# Patient Record
Sex: Male | Born: 1985 | Race: White | Hispanic: No | Marital: Married | Smoking: Never smoker
Health system: Southern US, Community
[De-identification: ages and names within clinical notes are randomized; demographics above are authoritative.]

## PROBLEM LIST (undated history)

## (undated) DIAGNOSIS — Z789 Other specified health status: Secondary | ICD-10-CM

## (undated) HISTORY — DX: Other specified health status: Z78.9

## (undated) HISTORY — PX: FINGER SURGERY: SHX640

---

## 2015-05-28 ENCOUNTER — Emergency Department (HOSPITAL_BASED_OUTPATIENT_CLINIC_OR_DEPARTMENT_OTHER): Payer: No Typology Code available for payment source

## 2015-05-28 ENCOUNTER — Emergency Department (HOSPITAL_BASED_OUTPATIENT_CLINIC_OR_DEPARTMENT_OTHER)
Admission: EM | Admit: 2015-05-28 | Discharge: 2015-05-29 | Disposition: A | Discharge status: Home / Self Care | Payer: No Typology Code available for payment source | Attending: Emergency Medicine | Admitting: Emergency Medicine

## 2015-05-28 ENCOUNTER — Encounter (HOSPITAL_BASED_OUTPATIENT_CLINIC_OR_DEPARTMENT_OTHER): Payer: Self-pay

## 2015-05-28 DIAGNOSIS — S6992XA Unspecified injury of left wrist, hand and finger(s), initial encounter: Secondary | ICD-10-CM | POA: Diagnosis present

## 2015-05-28 DIAGNOSIS — Y9289 Other specified places as the place of occurrence of the external cause: Secondary | ICD-10-CM | POA: Insufficient documentation

## 2015-05-28 DIAGNOSIS — Y999 Unspecified external cause status: Secondary | ICD-10-CM | POA: Diagnosis not present

## 2015-05-28 DIAGNOSIS — W2105XA Struck by basketball, initial encounter: Secondary | ICD-10-CM | POA: Insufficient documentation

## 2015-05-28 DIAGNOSIS — S62615A Displaced fracture of proximal phalanx of left ring finger, initial encounter for closed fracture: Secondary | ICD-10-CM | POA: Diagnosis not present

## 2015-05-28 DIAGNOSIS — S63285A Dislocation of proximal interphalangeal joint of left ring finger, initial encounter: Secondary | ICD-10-CM | POA: Diagnosis not present

## 2015-05-28 DIAGNOSIS — Y9367 Activity, basketball: Secondary | ICD-10-CM | POA: Diagnosis not present

## 2015-05-28 DIAGNOSIS — S63259A Unspecified dislocation of unspecified finger, initial encounter: Secondary | ICD-10-CM

## 2015-05-28 MED ORDER — BUPIVACAINE HCL (PF) 0.5 % IJ SOLN
10.0000 mL | Freq: Once | INTRAMUSCULAR | Status: AC
  Administered 2015-05-28: 10 mL
  Filled 2015-05-28: qty 10

## 2015-05-28 MED ORDER — HYDROCODONE-ACETAMINOPHEN 5-325 MG PO TABS: ORAL_TABLET | ORAL | Status: DC

## 2015-05-28 MED ORDER — LIDOCAINE HCL (PF) 1 % IJ SOLN: 30.0000 mL | Freq: Once | INTRAMUSCULAR | Status: DC

## 2015-05-28 NOTE — ED Notes (Signed)
Pt reports injuring his left 4th digit while playing basketball PTA.

## 2015-05-28 NOTE — Discharge Instructions (Signed)
For pain control please take ibuprofen (also known as Motrin or Advil) 800mg  (this is normally 4 over the counter pills) 3 times a day  for 5 days. Take with food to minimize stomach irritation.  Take vicodin for breakthrough pain, do not drink alcohol, drive, care for children or do other critical tasks while taking vicodin.  Please follow with your primary care doctor in the next 2 days for a check-up. They must obtain records for further management.   Do not hesitate to return to the Emergency Department for any new, worsening or concerning symptoms.    Finger Dislocation Finger dislocation is the displacement of bones in your finger at the joints. Most commonly, finger dislocation occurs at the proximal interphalangeal joint (the joint closest to your knuckle). Very strong, fibrous tissues (ligaments) and joint capsules connect the three bones of your fingers.  CAUSES Dislocation is caused by a forceful impact. This impact moves these bones off the joint and often tears your ligaments.  SYMPTOMS Symptoms of finger dislocation include:  Deformity of your finger.  Pain, with loss of movement. DIAGNOSIS  Finger dislocation is diagnosed with a physical exam. Often, X-ray exams are done to see if you have associated injuries, such as bone fractures. TREATMENT  Finger dislocations are treated by putting your bones back into position (reduction) either by manually moving the bones back into place or through surgery. Your finger is then kept in a fixed position (immobilized) with the use of a dressing or splint for a brief period. When your ligament has to be surgically repaired, it needs to be kept in a fixed position with a dressing or splint for 1 to 2 weeks. Because joint stiffness is a long-term complication of finger dislocation, hand exercises or physical therapy to increase the range of motion and to regain strength is usually started as soon as the ligament is healed. Exercises and  therapy generally last no more than 3 months. HOME CARE INSTRUCTIONS The following measures can help to reduce pain and speed up the healing process:  Rest your injured joint. Do not move until instructed otherwise by your caregiver. Avoid activities similar to the one that caused your injury.  Apply ice to your injured joint for the first day or 2 after your reduction or as directed by your caregiver. Applying ice helps to reduce inflammation and pain.  Put ice in a plastic bag.  Place a towel between your skin and the bag.  Leave the ice on for 15-20 minutes at a time, every 2 hours while you are awake.  Elevate your hand above your heart as directed by your caregiver to reduce swelling.  Take over-the-counter or prescription medicine for pain as your caregiver instructs you. SEEK IMMEDIATE MEDICAL CARE IF:  Your dressing or splint becomes damaged.  Your pain becomes worse rather than better.  You lose feeling in your finger, or it becomes cold and white. MAKE SURE YOU:  Understand these instructions.  Will watch your condition.  Will get help right away if you are not doing well or get worse. Document Released: 11/30/2000 Document Revised: 02/25/2012 Document Reviewed: 09/23/2011 [facility] Patient Information 2015 [place], [place]. This information is not intended to replace advice given to you by your health care provider. Make sure you discuss any questions you have with your health care provider.

## 2015-05-28 NOTE — ED Provider Notes (Signed)
CSN: [id number]     Arrival date & time 05/28/15  2028 History   First MD Initiated Contact with Patient 05/28/15 2046     Chief Complaint  Patient presents with  . Finger Injury     (Consider location/radiation/quality/duration/timing/severity/associated sxs/prior Treatment) HPI   Steve Park is a 29 y.o. male complaining of severe pain and decreased range of motion to left ring finger after patient was playing basketball just prior to arrival and he jammed the finger into a basketball. Patient is right-hand-dominant. No pain medication taken prior to arrival.pain is exacerbated by palpation. Pt is getting married in 2 weeks.  History reviewed. No pertinent past medical history. History reviewed. No pertinent past surgical history. History reviewed. No pertinent family history. History  Substance Use Topics  . Smoking status: Never Smoker   . Smokeless tobacco: Not on file  . Alcohol Use: Yes     Comment: social    Review of Systems  10 systems reviewed and found to be negative, except as noted in the HPI.   Allergies  Review of patient's allergies indicates no known allergies.  Home Medications   Prior to Admission medications   Medication Sig Start Date End Date Taking? Authorizing Provider  HYDROcodone-acetaminophen (NORCO/VICODIN) 5-325 MG per tablet Take 1-2 tablets by mouth every 6 hours as needed for pain and/or cough. 05/28/15   Jonathin Heinicke, PA-C   BP 122/94 mmHg  Pulse 91  Temp(Src) 98.1 F (36.7 C) (Oral)  Resp 16  Ht 6' (1.829 m)  Wt 180 lb (81.647 kg)  BMI 24.41 kg/m2  SpO2 96% Physical Exam  Constitutional: He is oriented to person, place, and time. He appears well-developed and well-nourished. No distress.  HENT:  Head: Normocephalic.  Eyes: Conjunctivae and EOM are normal.  Cardiovascular: Normal rate.   Pulmonary/Chest: Effort normal. No stridor.  Musculoskeletal: Normal range of motion. He exhibits edema and tenderness.  Swelling and  deformity to left fourth digit PIP, no overlying skin changes, he is distally neurovascularly intact.  Neurological: He is alert and oriented to person, place, and time.  Psychiatric: He has a normal mood and affect.  Nursing note and vitals reviewed.   ED Course  Reduction of dislocation Date/Time: 05/28/2015 10:16 PM Performed by: Wynetta Emery Authorized by: Wynetta Emery Consent: Verbal consent obtained. Consent given by: patient Patient identity confirmed: verbally with patient Time out: Immediately prior to procedure a "time out" was called to verify the correct patient, procedure, equipment, support staff and site/side marked as required. Preparation: Patient was prepped and draped in the usual sterile fashion. Local anesthesia used: digital block with 2% Marcaine: 4 mL total. Patient sedated: no Patient tolerance: Patient tolerated the procedure well with no immediate complications  SPLINT APPLICATION Date/Time: 11:32 PM Authorized by: Wynetta Emery Consent: Verbal consent obtained. Risks and benefits: risks, benefits and alternatives were discussed Consent given by: patient Splint applied by: emt Location details: left 4rth finger Splint type: metal pre-fab  Post-procedure: The splinted body part was neurovascularly unchanged following the procedure. Patient tolerance: Patient tolerated the procedure well with no immediate complications.    Labs Review Labs Reviewed - No data to display  Imaging Review Dg Finger Ring Left  05/28/2015   CLINICAL DATA:  Status post reduction of left fourth finger dislocation. Subsequent encounter.  EXAM: LEFT RING FINGER 2+V  COMPARISON:  Left fourth finger radiographs performed earlier today at 8:40 p.m.  FINDINGS: There has been interval reduction of the patient's left fourth proximal  interphalangeal joint dislocation, though there is mild residual rotation and dorsal subluxation at the fourth proximal interphalangeal  joint. A small associated osseous fragment is seen arising at the volar aspect of the base of the fourth middle phalanx. Mild residual displacement likely reflects some degree of underlying ligamentous injury. Surrounding soft tissue swelling is noted.  Remaining visualized joint spaces are preserved.  IMPRESSION: Interval reduction of left fourth proximal interphalangeal joint dislocation, though there is mild residual rotation and dorsal subluxation at the fourth proximal interphalangeal joint. Small associated osseous fragment arising at the volar aspect of the base of the fourth middle phalanx. Mild residual displacement likely reflects some degree of underlying ligamentous injury.   Electronically Signed   By: Roanna Raider M.D.   On: 05/28/2015 22:43   Dg Finger Ring Left  05/28/2015   CLINICAL DATA:  29 year old male with history of trauma to the left hand while playing basketball. Pain in the left fourth finger.  EXAM: LEFT RING FINGER 2+V  COMPARISON:  No priors.  FINDINGS: There is a fracture dislocation at the fourth PIP joint. Specifically, the middle phalanx is dorsally dislocated, and slightly ulnarly displaced. There is a small fracture fragment from the volar plate at the base of the middle phalanx. There is also mild osseous irregularity along the radial aspect of the proximal phalanx distally, which could represent a capsular avulsion fracture.  IMPRESSION: 1. Fracture dislocation at the left fourth PIP joint, as above.   Electronically Signed   By: Trudie Reed M.D.   On: 05/28/2015 20:59     EKG Interpretation None      MDM   Final diagnoses:  Finger dislocation, initial encounter    Filed Vitals:   05/28/15 2033  BP: 122/94  Pulse: 91  Temp: 98.1 F (36.7 C)  TempSrc: Oral  Resp: 16  Height: 6' (1.829 m)  Weight: 180 lb (81.647 kg)  SpO2: 96%    Medications  bupivacaine (MARCAINE) 0.5 % injection 10 mL (10 mLs Infiltration Given 05/28/15 2130)    Steve Ard  Park is a pleasant 29 y.o. male presenting with dislocation to left fourth digit proximal interphalangeal joint with a small fracture. This is closed, patient is neurovascularly intact, digital block is performed in finger is reduced, PA-C a shows better anatomical alignment. Patient is placed in a splint and asked to follow with hand surgeon Dr. Janee Morn. Postreduction film shows better alignment however there is mild residual rotation and dorsal subluxation. Patient is patent placed in a splint and advised to follow closely with Dr. Janee Morn.  Evaluation does not show pathology that would require ongoing emergent intervention or inpatient treatment. Pt is hemodynamically stable and mentating appropriately. Discussed findings and plan with patient/guardian, who agrees with care plan. All questions answered. Return precautions discussed and outpatient follow up given.   New Prescriptions   HYDROCODONE-ACETAMINOPHEN (NORCO/VICODIN) 5-325 MG PER TABLET    Take 1-2 tablets by mouth every 6 hours as needed for pain and/or cough.       Wynetta Emery, PA-C 05/28/15 2332  Arby Barrette, MD 05/28/15 [phone]

## 2015-05-29 NOTE — ED Notes (Signed)
Bupivacaine at bedside. 

## 2016-07-16 IMAGING — CR DG FINGER RING 2+V*L*
3 series · 3 of 3 positions shown · non-contrast
Comparison: No priors.

CLINICAL DATA: 28-year-old male with history of trauma to the left
hand while playing basketball. Pain in the left fourth finger.

EXAM:
LEFT RING FINGER 2+V

[x finger pa left]
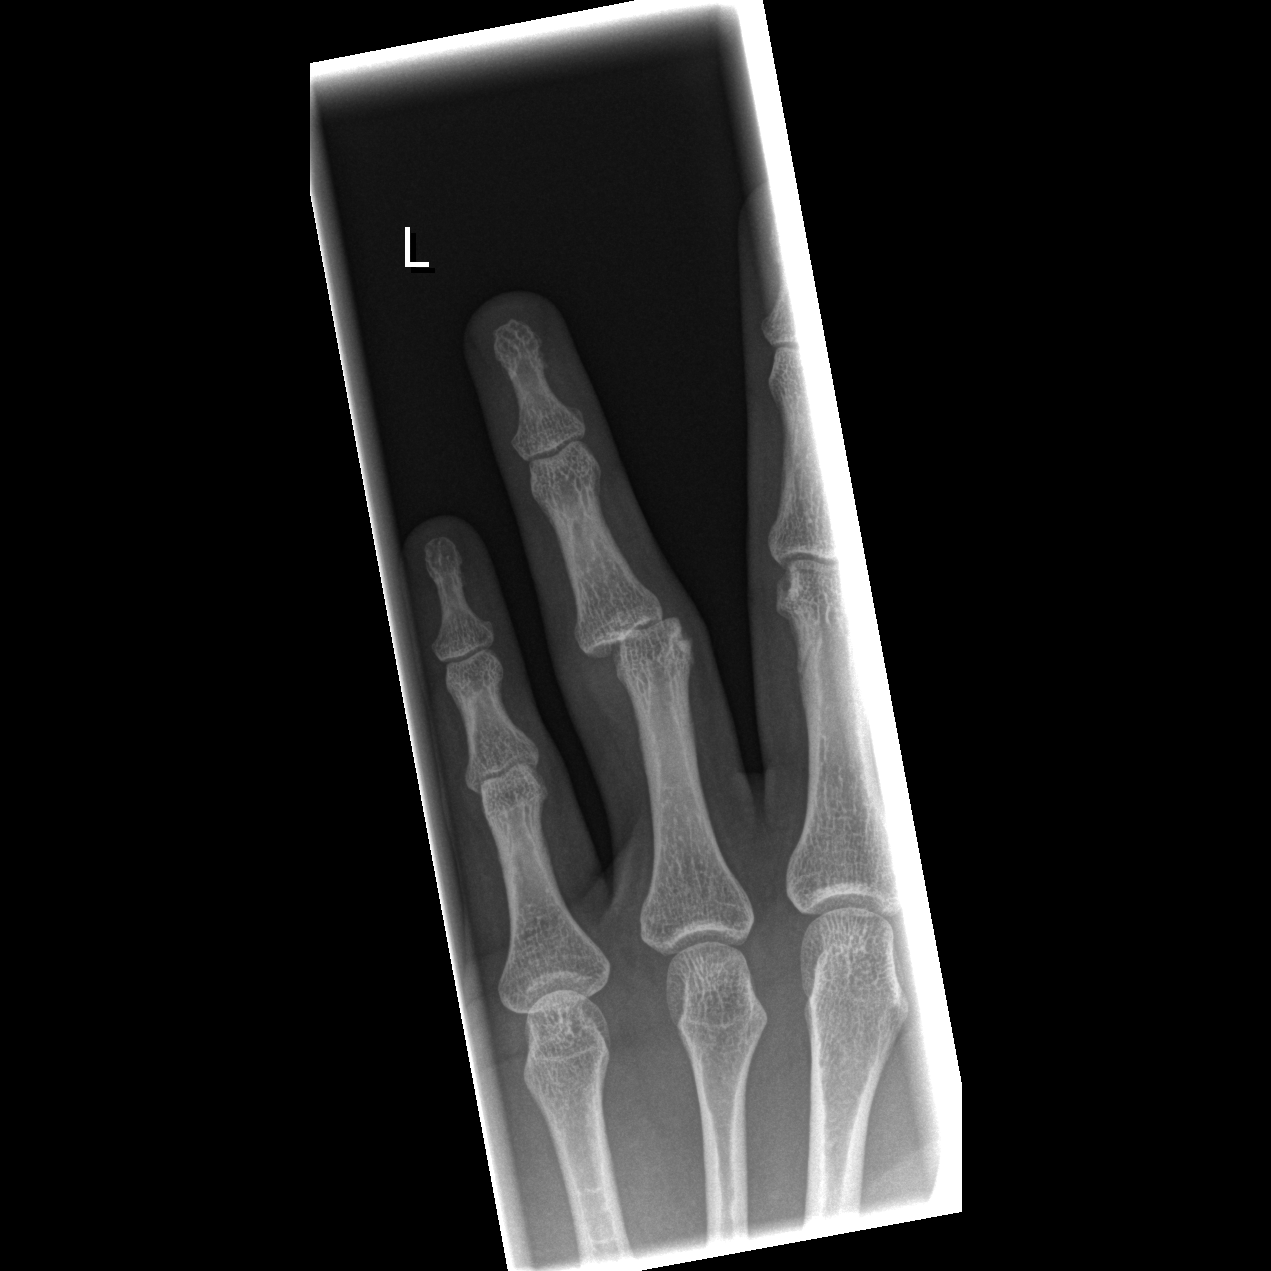

[x finger obl. left]
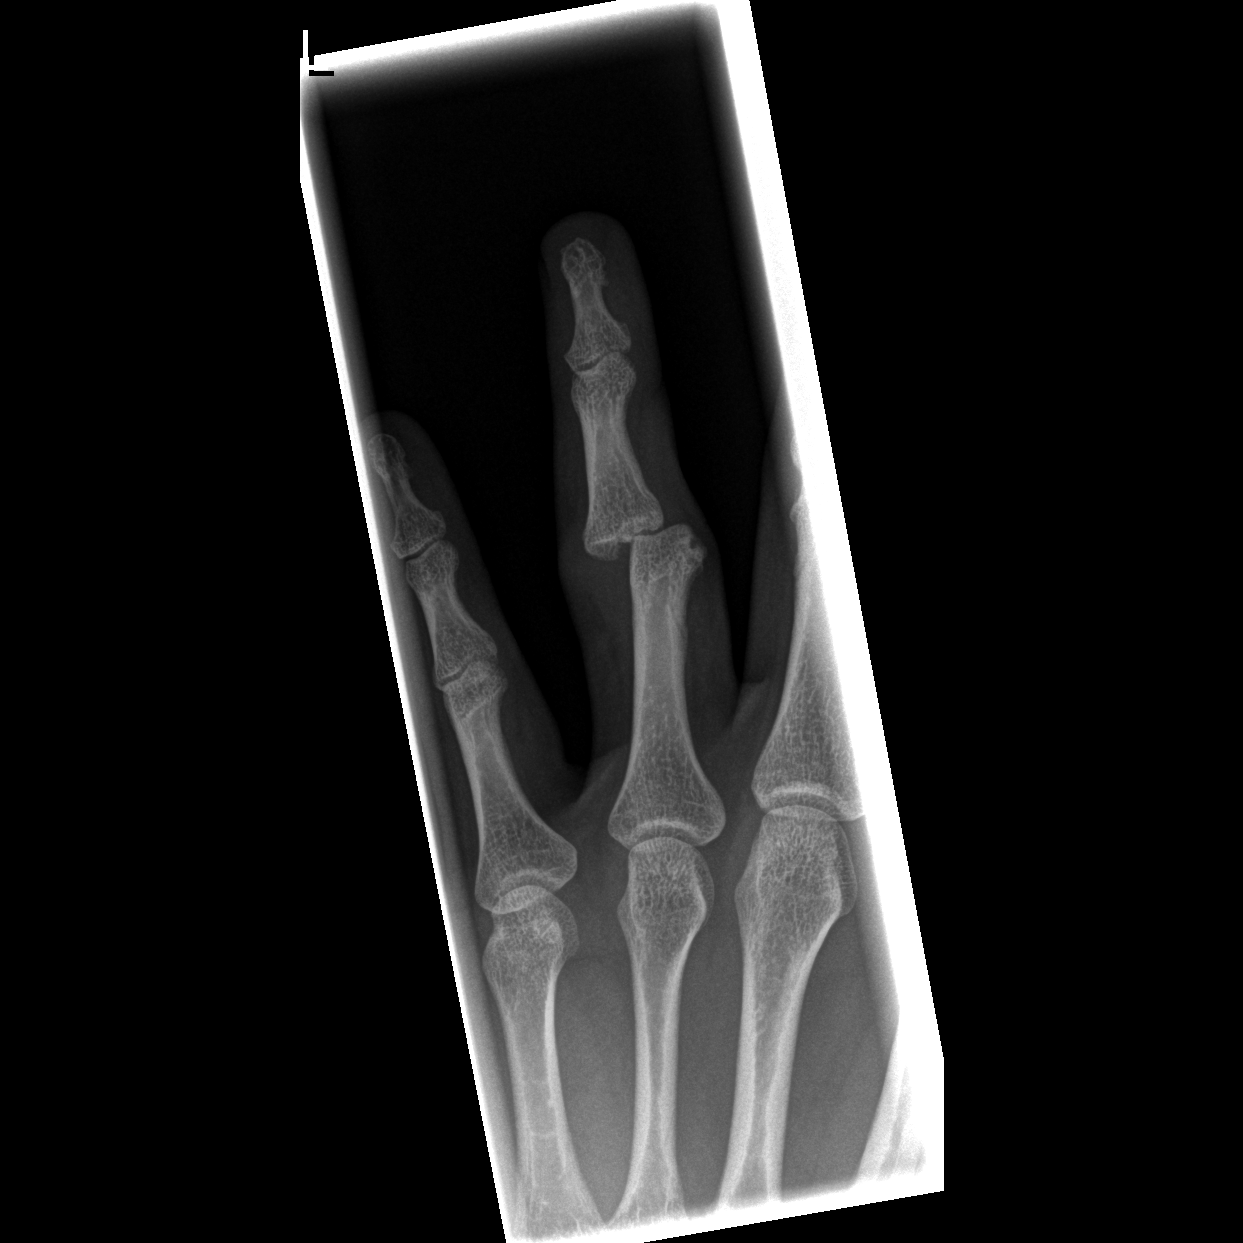

[x finger lateral left]
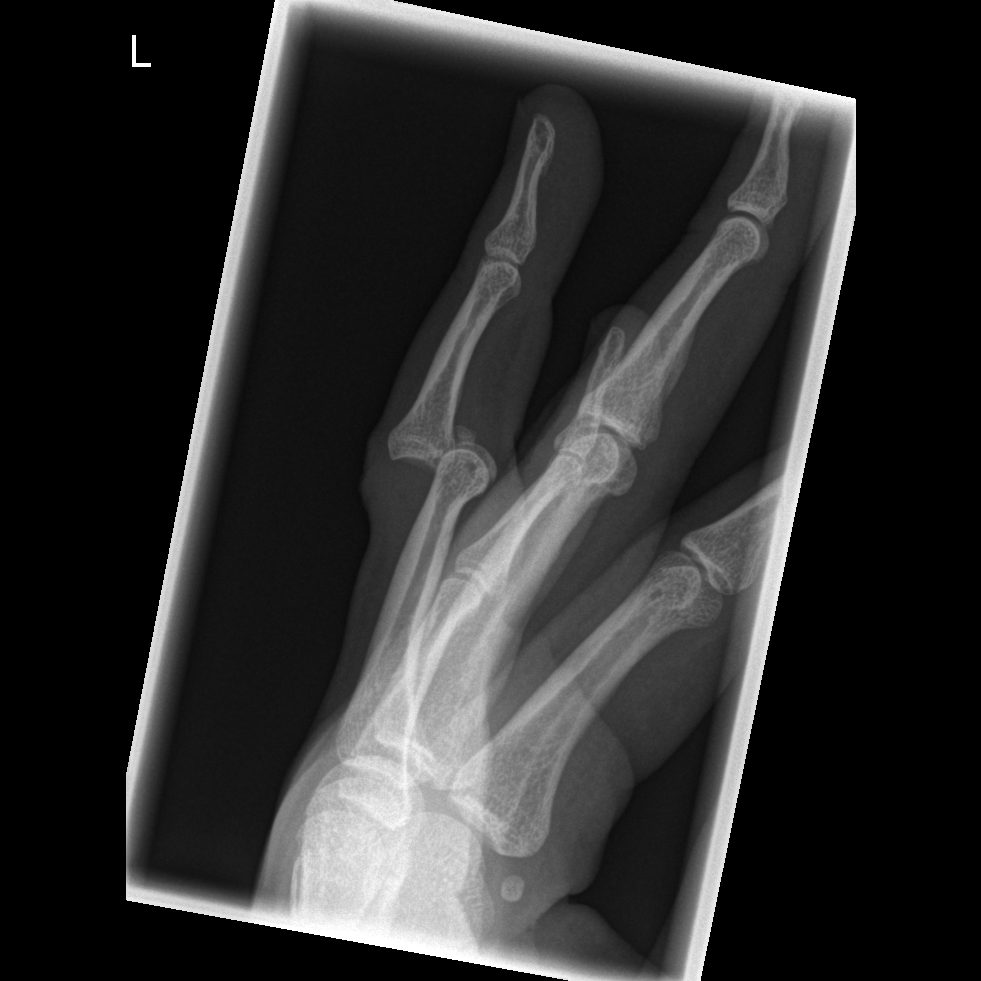

[3 of 3 positions shown; findings below may reference images not displayed]

FINDINGS: There is a fracture dislocation at the fourth PIP joint.
Specifically, the middle phalanx is dorsally dislocated, and
slightly ulnarly displaced. There is a small fracture fragment from
the volar plate at the base of the middle phalanx. There is also
mild osseous irregularity along the radial aspect of the proximal
phalanx distally, which could represent a capsular avulsion
fracture.
IMPRESSION: 1. Fracture dislocation at the left fourth PIP joint, as above.

## 2021-11-03 ENCOUNTER — Emergency Department
Admit: 2021-11-03 | Discharge: 2021-11-03 | Discharge status: Against Medical Advice | Payer: No Typology Code available for payment source

## 2023-07-11 ENCOUNTER — Ambulatory Visit (INDEPENDENT_AMBULATORY_CARE_PROVIDER_SITE_OTHER): Payer: BC Managed Care – PPO | Admitting: Urology

## 2023-07-11 ENCOUNTER — Encounter: Payer: Self-pay | Admitting: Urology

## 2023-07-11 VITALS — BP 151/79 | HR 75 | Ht 72.0 in | Wt 206.0 lb

## 2023-07-11 DIAGNOSIS — Z3009 Encounter for other general counseling and advice on contraception: Secondary | ICD-10-CM | POA: Diagnosis not present

## 2023-07-11 NOTE — Progress Notes (Signed)
Assessment: 1. Encounter for vasectomy assessment     Plan: Schedule for vasectomy per patient request He declined rx for alprazolam  Chief Complaint:  Chief Complaint  Patient presents with   VAS Consult    History of Present Illness:  Steve Park is a 37 y.o. male who is seen for vasectomy evaluation. He is married with 2 children.  No history of scrotal trauma or infection.   Past Medical History:  Past Medical History:  Diagnosis Date   No known health problems     Past Surgical History:  Past Surgical History:  Procedure Laterality Date   FINGER SURGERY      Allergies:  Allergies  Allergen Reactions   Azithromycin Hives    Family History:  History reviewed. No pertinent family history.  Social History:  Social History   Tobacco Use   Smoking status: Never  Substance Use Topics   Alcohol use: Yes    Comment: social    Review of symptoms:  Constitutional:  Negative for unexplained weight loss, night sweats, fever, chills ENT:  Negative for nose bleeds, sinus pain, painful swallowing CV:  Negative for chest pain, shortness of breath, exercise intolerance, palpitations, loss of consciousness Resp:  Negative for cough, wheezing, shortness of breath GI:  Negative for nausea, vomiting, diarrhea, bloody stools GU:  Positives noted in HPI; otherwise negative for gross hematuria, dysuria, urinary incontinence Neuro:  Negative for seizures, poor balance, limb weakness, slurred speech Psych:  Negative for lack of energy, depression, anxiety Endocrine:  Negative for polydipsia, polyuria, symptoms of hypoglycemia (dizziness, hunger, sweating) Hematologic:  Negative for anemia, purpura, petechia, prolonged or excessive bleeding, use of anticoagulants  Allergic:  Negative for difficulty breathing or choking as a result of exposure to anything; no shellfish allergy; no allergic response (rash/itch) to materials, foods  Physical exam: BP (!) 151/79    Pulse 75   Ht 6' (1.829 m)   Wt 206 lb (93.4 kg)   BMI 27.94 kg/m  GENERAL APPEARANCE:  Well appearing, well developed, well nourished, NAD HEENT:  Atraumatic, normocephalic, oropharynx clear NECK:  Supple without lymphadenopathy or thyromegaly ABDOMEN:  Soft, non-tender, no masses EXTREMITIES:  Moves all extremities well, without clubbing, cyanosis, or edema NEUROLOGIC:  Alert and oriented x 3, normal gait, CN II-XII grossly intact MENTAL STATUS:  appropriate BACK:  Non-tender to palpation, No CVAT SKIN:  Warm, dry, and intact GU: Penis:  circumcised Meatus: Normal Scrotum: Vas palpated bilaterally Testis: normal without masses bilateral Epididymis: normal  Results: None  VASECTOMY CONSULTATION  Steve Park presents for vasectomy consultation today.  He is a 37 y.o. male, Married with 2  children .  He and his wife have discussed the issues regarding long-term fertility and are comfortable with this decision.  He presents for consideration for vasectomy.  I discussed the issues in detail with him today and he expressed no reservations.  As to the procedure, no scalpel technique vasectomy is explained and reviewed in detail.  Generalized risks including but not limited to bleeding, infection, orchalgia, testicular atrophy, epididymitis, scrotal hematoma, and chronic pain are discussed.   Additionally, he understands that the possibility of vas recanalization following vasectomy is possible although rare.  Most importantly, the patient understands that he is not sterile initially and will need a semen analysis check to confirm sterility such that no sperm are seen.  He is advised to avoid ejaculation for 10 days following the procedure.  The initial semen analysis will be checked  in approximately 12 weeks and in some patients, several months may be required for clearance of all sperm.  He reports a clear understanding of the need for continued birth control until sterility is  confirmed.  Otherwise, general issues regarding local anesthesia, prep, alprazolam are discussed and he reports a clear understanding.

## 2023-07-11 NOTE — Patient Instructions (Signed)
Taking care of yourself after a VASECTOMY                                              Patient Information Sheet        The following information will reinforce some of the instructions that your doctor has given you.  Day of Procedure: 1) Wear the scrotal supporter and gauze pad 2) Use an ice pack on the scrotum for 15 minutes every hour for 48 hours to help reduce discomfort, swelling and bruising (do NOT place ice directly on your skin, but place on top of the supporter) 3) Expect some clear to pinkish drainage at the surgical site for the first 24-48 hours 4) If needed, use pain medications provided or ibuprofen 800 mg every 8 hours for discomfort 5) Avoid strenuous activities like mowing, lifting, jogging and exercising for 1 week.  Take it easy! 6) If you develop a fever over 101 F or sudden onset of significant swelling within the first 12 hours, please call to report this to your doctor as soon as possible.     Day Two and Three: 1) You may take a shower, but avoid tubs, pools or hot tubs. 2) Continue to wear the scrotal supporter as needed for comfort and change or remove the gauze pad if desired 3) Keep taking it easy!  Avoid strenuous activities like mowing, lifting, jogging and exercising.   4) Continue to watch for signs or symptoms of fever or significant swelling 5) Apply a small amount of antibiotic ointment to incision 1-2 times/day  The rest of the week: 1) Gradually return to normal physical activities after one week.  A return of soreness might mean you are        "doing too much too soon". 2) Avoid sexual activity for 10 days after the procedure 3) Continue to take a shower, but avoid tubs, pools or hot tubs 4) Wearing the scrotal supporter is optional based on your comfort.     Remember to use an alternate form of contraception for 3 months until you have been checked and CLEARED by your urologist!  3-Month lab appointment:  1) The lab technician will need to  look at a semen sample under a microscope  2) Use the specimen cup provided to collect the sample AT HOME 1 hour before the appointment  3) DO NOT refrigerate the specimen, but keep at room or body temperature  4) Avoid ejaculation for 2-5 days before collecting the specimen  5) Collect the entire specimen by masturbation using NO lubricant  6) Make sure your name, MR number, date and time of collection are on the cup  

## 2024-01-15 ENCOUNTER — Other Ambulatory Visit: Payer: Self-pay | Admitting: Urology

## 2024-01-15 ENCOUNTER — Telehealth: Payer: Self-pay | Admitting: Urology

## 2024-01-15 MED ORDER — ALPRAZOLAM 1 MG PO TABS: ORAL_TABLET | ORAL | 0 refills | Status: AC

## 2024-01-15 NOTE — Telephone Encounter (Signed)
Pt would like Valium sent to the pharmacy for his Vasectomy coming up on Monday 02/03.   [facility] [address] [place], [place] [address]

## 2024-01-15 NOTE — Telephone Encounter (Signed)
Pt notified. Pt also advised on $500 deposit the day of his vasectomy. He verbalized understanding.

## 2024-01-20 ENCOUNTER — Ambulatory Visit: Payer: BC Managed Care – PPO | Admitting: Urology

## 2024-01-20 ENCOUNTER — Encounter: Payer: Self-pay | Admitting: Urology

## 2024-01-20 VITALS — BP 131/90 | HR 70 | Ht 72.0 in | Wt 213.0 lb

## 2024-01-20 DIAGNOSIS — Z302 Encounter for sterilization: Secondary | ICD-10-CM

## 2024-01-20 MED ORDER — CEPHALEXIN 500 MG PO CAPS: 500.0000 mg | ORAL_CAPSULE | Freq: Three times a day (TID) | ORAL | 0 refills | Status: AC

## 2024-01-20 MED ORDER — HYDROCODONE-ACETAMINOPHEN 5-325 MG PO TABS: 1.0000 | ORAL_TABLET | Freq: Four times a day (QID) | ORAL | 0 refills | Status: AC | PRN

## 2024-01-20 NOTE — Progress Notes (Signed)
  Assessment: 1. Encounter for vasectomy     Plan: Routine post vasectomy instructions given. Prescription sent. Return for post vasectomy semen analysis in 12 weeks.  Chief Complaint:  Chief Complaint  Patient presents with   VAS    History of Present Illness:  Steve Park is a 38 y.o. male who is seen for vasectomy. He is married with 2 children.  No history of scrotal trauma or infection.   Past Medical History:  Past Medical History:  Diagnosis Date   No known health problems     Past Surgical History:  Past Surgical History:  Procedure Laterality Date   FINGER SURGERY      Allergies:  Allergies  Allergen Reactions   Azithromycin Hives    Family History:  No family history on file.  Social History:  Social History   Tobacco Use   Smoking status: Never  Substance Use Topics   Alcohol use: Yes    Comment: social    ROS: Constitutional:  Negative for fever, chills, weight loss CV: Negative for chest pain, previous MI, hypertension Respiratory:  Negative for shortness of breath, wheezing, sleep apnea, frequent cough GI:  Negative for nausea, vomiting, bloody stool, GERD  Physical exam: BP (!) 131/90   Pulse 70   Ht 6' (1.829 m)   Wt 213 lb (96.6 kg)   BMI 28.89 kg/m  GENERAL APPEARANCE:  Well appearing, well developed, well nourished, NAD HEENT:  Atraumatic, normocephalic, oropharynx clear NECK:  Supple without lymphadenopathy or thyromegaly ABDOMEN:  Soft, non-tender, no masses EXTREMITIES:  Moves all extremities well, without clubbing, cyanosis, or edema NEUROLOGIC:  Alert and oriented x 3, normal gait, CN II-XII grossly intact MENTAL STATUS:  appropriate BACK:  Non-tender to palpation, No CVAT SKIN:  Warm, dry, and intact  Results: None  VASECTOMY PROCEDURE:  [name] presents for vasectomy following previous vasectomy consultation and permit is signed.  The patient's anterior scrotal wall is shaved and prepped with  Betadine in standard sterile fashion.  1% lidocaine is used as local anesthetic in the scrotal and peri vasal tissue.  A standard median raphe punch incision is made and a no scalpel technique vasectomy is performed.  Bilateral vas are isolated from the peri vasal tissue and an approximately 1 cm segment of vas is excised.  Proximal and distal segments are internally cauterized with electric heat cautery. Interposition of perivasal tissue was performed.  Bilateral palpation confirms bilateral vasectomy defect and no significant bleeding or hematoma is identified.  Neosporin gauze dressing and a scrotal support are applied.    Disposition: Patient is discharged home with Rx for pain medication and antibiotics.  Patient is given routine vasectomy instructions.   Most importantly, he is instructed and cautioned again regarding the need for protected intercourse until such time that a single  negative semen analysis has been obtained.  The initial semen analysis will be checked in approximately 12  weeks.  The patient reports a clear understanding.  He will call with any interval questions or concerns.

## 2024-01-20 NOTE — Patient Instructions (Signed)
Taking care of yourself after a VASECTOMY                                              Patient Information Sheet        The following information will reinforce some of the instructions that your doctor has given you.  Day of Procedure: 1) Wear the scrotal supporter and gauze pad 2) Use an ice pack on the scrotum for 15 minutes every hour for 48 hours to help reduce discomfort, swelling and bruising (do NOT place ice directly on your skin, but place on top of the supporter) 3) Expect some clear to pinkish drainage at the surgical site for the first 24-48 hours 4) If needed, use pain medications provided or ibuprofen 800 mg every 8 hours for discomfort 5) Avoid strenuous activities like mowing, lifting, jogging and exercising for 1 week.  Take it easy! 6) If you develop a fever over 101 F or sudden onset of significant swelling within the first 12 hours, please call to report this to your doctor as soon as possible.     Day Two and Three: 1) You may take a shower, but avoid tubs, pools or hot tubs. 2) Continue to wear the scrotal supporter as needed for comfort and change or remove the gauze pad if desired 3) Keep taking it easy!  Avoid strenuous activities like mowing, lifting, jogging and exercising.   4) Continue to watch for signs or symptoms of fever or significant swelling 5) Apply a small amount of antibiotic ointment to incision 1-2 times/day  The rest of the week: 1) Gradually return to normal physical activities after one week.  A return of soreness might mean you are        "doing too much too soon". 2) Avoid sexual activity for 10 days after the procedure 3) Continue to take a shower, but avoid tubs, pools or hot tubs 4) Wearing the scrotal supporter is optional based on your comfort.     Remember to use an alternate form of contraception for 3 months until you have been checked and CLEARED by your urologist!  3-Month lab appointment:  1) The lab technician will need to  look at a semen sample under a microscope  2) Use the specimen cup provided to collect the sample AT HOME 1 hour before the appointment  3) DO NOT refrigerate the specimen, but keep at room or body temperature  4) Avoid ejaculation for 2-5 days before collecting the specimen  5) Collect the entire specimen by masturbation using NO lubricant  6) Make sure your name, MR number, date and time of collection are on the cup  

## 2024-04-20 ENCOUNTER — Other Ambulatory Visit: Payer: BC Managed Care – PPO

## 2024-04-21 ENCOUNTER — Other Ambulatory Visit

## 2024-04-30 ENCOUNTER — Other Ambulatory Visit: Payer: Self-pay

## 2024-04-30 ENCOUNTER — Other Ambulatory Visit

## 2024-04-30 DIAGNOSIS — Z302 Encounter for sterilization: Secondary | ICD-10-CM

## 2024-05-01 LAB — POST-VAS SPERM EVALUATION,QUAL: Volume: 2.6 mL

## 2024-05-04 ENCOUNTER — Ambulatory Visit: Payer: Self-pay | Admitting: Urology
